# Patient Record
Sex: Male | Born: 1975 | Race: White | Hispanic: No | Smoking: Never smoker
Health system: Southern US, Community
[De-identification: ages and names within clinical notes are randomized; demographics above are authoritative.]

## PROBLEM LIST (undated history)

## (undated) DIAGNOSIS — I10 Essential (primary) hypertension: Secondary | ICD-10-CM

## (undated) DIAGNOSIS — M67439 Ganglion, unspecified wrist: Secondary | ICD-10-CM

## (undated) DIAGNOSIS — H109 Unspecified conjunctivitis: Secondary | ICD-10-CM

## (undated) DIAGNOSIS — R0789 Other chest pain: Secondary | ICD-10-CM

## (undated) HISTORY — DX: Unspecified conjunctivitis: H10.9

## (undated) HISTORY — DX: Ganglion, unspecified wrist: M67.439

## (undated) HISTORY — DX: Essential (primary) hypertension: I10

## (undated) HISTORY — DX: Other chest pain: R07.89

---
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
## 2012-07-19 ENCOUNTER — Encounter: Payer: Self-pay | Admitting: *Deleted

## 2012-08-02 ENCOUNTER — Ambulatory Visit (INDEPENDENT_AMBULATORY_CARE_PROVIDER_SITE_OTHER): Payer: BC Managed Care – PPO | Admitting: Family Medicine

## 2012-08-02 ENCOUNTER — Encounter: Payer: Self-pay | Admitting: Family Medicine

## 2012-08-02 VITALS — BP 131/87 | HR 102 | Temp 98.1°F | Resp 18 | Wt 196.0 lb

## 2012-08-02 DIAGNOSIS — Z1159 Encounter for screening for other viral diseases: Secondary | ICD-10-CM

## 2012-08-02 DIAGNOSIS — Z23 Encounter for immunization: Secondary | ICD-10-CM

## 2012-08-02 NOTE — Patient Instructions (Addendum)
1)  Tdap good for 10 years  2)  MMR- We'll mail you your result.

## 2012-08-03 LAB — MEASLES/MUMPS/RUBELLA IMMUNITY
Mumps IgG: 89 AU/mL — ABNORMAL HIGH (ref <9.00)
Rubella: 7.89 Index — ABNORMAL HIGH (ref <0.90)
Rubeola IgG: >300 AU/mL — ABNORMAL HIGH (ref <25.00)

## 2012-08-12 ENCOUNTER — Encounter: Payer: Self-pay | Admitting: Family Medicine

## 2012-08-12 DIAGNOSIS — Z23 Encounter for immunization: Secondary | ICD-10-CM | POA: Insufficient documentation

## 2012-08-12 DIAGNOSIS — Z1159 Encounter for screening for other viral diseases: Secondary | ICD-10-CM | POA: Insufficient documentation

## 2012-08-12 NOTE — Progress Notes (Signed)
Subjective:    Patient ID: Jeremy Pitts, male    DOB: 28-Feb-1976, 37 y.o.   MRN: [id number]  HPI   Jeremy Pitts is here today to get some things done for school.  He needs to have a MMR titer and he needs a Tdap.  He has done well since his last visit.  He has not had any additional infections.     Review of Systems  Constitutional: Positive for fatigue and unexpected weight change. Negative for activity change and appetite change.  HENT: Negative.   Eyes: Negative.   Respiratory: Negative for cough, chest tightness and shortness of breath.   Cardiovascular: Negative for chest pain, palpitations and leg swelling.  Gastrointestinal: Negative for abdominal pain, diarrhea, constipation and blood in stool.  Endocrine: Negative for polydipsia, polyphagia and polyuria.  Genitourinary: Negative for difficulty urinating.  Musculoskeletal: Negative for myalgias, joint swelling and arthralgias.  Skin: Negative for rash.  Neurological: Negative for dizziness, numbness and headaches.  Hematological: Negative.   Psychiatric/Behavioral: Negative.    Past Medical History  Diagnosis Date  . Conjunctivitis    Past Surgical History  Procedure Laterality Date  . Tonsillectomy     Family History  Problem Relation Age of Onset  . Hypertension Mother   . Hypertension Paternal Uncle   . Stroke Maternal Grandfather    History   Social History  . Marital Status: Unknown    Spouse Name: Jeremy Pitts     Number of Children: 1  . Years of Education: 16   Occupational History  . [employer] rights manager    Social History Main Topics  . Smoking status: Never Smoker   . Smokeless tobacco: Never Used  . Alcohol Use: 2.0 oz/week    4 drink(s) per week     Comment: He will usually have 2 Beers or Glasses of wine twice a week.    . Drug Use: No  . Sexually Active: Yes -- Male partner(s)   Other Topics Concern  . Not on file   Social History Narrative   Marital Status: Married Jeremy Pitts)    Children:  Son Jeremy Pitts)    Pets: Dogs (2) Cats (2) Rabbit (1) Legend Pig (1)    Living Situation: Lives with wife and son.    Occupation: Secretary/administrator    Education: Archivist)    Tobacco Use/Exposure:  None    Alcohol Use:  Occasional (2 Beer or Wine twice per week)    Drug Use:  None   Diet:  Regular   Exercise:  None    Hobbies: Hiking                      Objective:   Physical Exam  Constitutional: He appears well-nourished.  HENT:  Head: Normocephalic.  Nose: Nose normal.  Mouth/Throat: Oropharynx is clear and moist.  Eyes: Conjunctivae are normal. No scleral icterus.  Neck: Neck supple. No thyromegaly present.  Cardiovascular: Normal rate, regular rhythm and normal heart sounds.   Pulmonary/Chest: Effort normal and breath sounds normal.  Abdominal: Soft. He exhibits no mass. There is no tenderness.  Musculoskeletal: Normal range of motion.  Lymphadenopathy:    He has no cervical adenopathy.  Neurological: He is alert.  Skin: Skin is warm and dry. No rash noted.  Psychiatric: He has a normal mood and affect. His behavior is normal. Judgment and thought content normal.  Assessment & Plan:   1)  Checking a MMR titer  2)  Tdap was given without difficulty.

## 2012-10-09 ENCOUNTER — Emergency Department (HOSPITAL_BASED_OUTPATIENT_CLINIC_OR_DEPARTMENT_OTHER): Payer: BC Managed Care – PPO

## 2012-10-09 ENCOUNTER — Encounter (HOSPITAL_BASED_OUTPATIENT_CLINIC_OR_DEPARTMENT_OTHER): Payer: Self-pay | Admitting: *Deleted

## 2012-10-09 ENCOUNTER — Emergency Department (HOSPITAL_BASED_OUTPATIENT_CLINIC_OR_DEPARTMENT_OTHER)
Admission: EM | Admit: 2012-10-09 | Discharge: 2012-10-09 | Disposition: A | Discharge status: Home / Self Care | Payer: BC Managed Care – PPO | Attending: Emergency Medicine | Admitting: Emergency Medicine

## 2012-10-09 DIAGNOSIS — T148XXA Other injury of unspecified body region, initial encounter: Secondary | ICD-10-CM

## 2012-10-09 DIAGNOSIS — Y9289 Other specified places as the place of occurrence of the external cause: Secondary | ICD-10-CM | POA: Insufficient documentation

## 2012-10-09 DIAGNOSIS — Y9319 Activity, other involving water and watercraft: Secondary | ICD-10-CM | POA: Insufficient documentation

## 2012-10-09 DIAGNOSIS — Z8669 Personal history of other diseases of the nervous system and sense organs: Secondary | ICD-10-CM | POA: Insufficient documentation

## 2012-10-09 DIAGNOSIS — Y9315 Activity, underwater diving and snorkeling: Secondary | ICD-10-CM | POA: Insufficient documentation

## 2012-10-09 DIAGNOSIS — R0789 Other chest pain: Secondary | ICD-10-CM | POA: Insufficient documentation

## 2012-10-09 DIAGNOSIS — S43499A Other sprain of unspecified shoulder joint, initial encounter: Secondary | ICD-10-CM | POA: Insufficient documentation

## 2012-10-09 DIAGNOSIS — R51 Headache: Secondary | ICD-10-CM | POA: Insufficient documentation

## 2012-10-09 DIAGNOSIS — T751XXA Unspecified effects of drowning and nonfatal submersion, initial encounter: Secondary | ICD-10-CM | POA: Insufficient documentation

## 2012-10-09 DIAGNOSIS — M549 Dorsalgia, unspecified: Secondary | ICD-10-CM | POA: Insufficient documentation

## 2012-10-09 LAB — URINALYSIS, ROUTINE W REFLEX MICROSCOPIC
Glucose, UA: NEGATIVE mg/dL
Leukocytes, UA: NEGATIVE
Nitrite: NEGATIVE
Protein, ur: NEGATIVE mg/dL

## 2012-10-09 NOTE — ED Provider Notes (Signed)
History    CSN: [id number] Arrival date & time 10/09/12  1335  First MD Initiated Contact with Patient 10/09/12 1359     Chief Complaint  Patient presents with  . Dizziness   (Consider location/radiation/quality/duration/timing/severity/associated sxs/prior Treatment) The history is provided by the patient.   [place] He states he had 2 dives at 120 feet. He states one had a down time of 20 minutes and one had 25 minutes. He states on one of them he missed a safety stop on the way out. He states after it began to feel some tightness in his chest. He feels a little tingling in both of his hands. No Weakness. No Confusion. No hemoptysis. No plane trip. Patient states that he was using someone else's equipment and he may not have been weighted properly for him. He states he was coming quickly, but doesn't think he was coming to fast. He states he just was not able to stop at 15 feet. Past Medical History  Diagnosis Date  . Conjunctivitis    Past Surgical History  Procedure Laterality Date  . Tonsillectomy     Family History  Problem Relation Age of Onset  . Hypertension Mother   . Hypertension Paternal Uncle   . Stroke Maternal Grandfather    History  Substance Use Topics  . Smoking status: Never Smoker   . Smokeless tobacco: Never Used  . Alcohol Use: 2.0 oz/week    4 drink(s) per week     Comment: He will usually have 2 Beers or Glasses of wine twice a week.      Review of Systems  Constitutional: Negative for activity change and appetite change.  HENT: Negative for neck stiffness.   Eyes: Negative for pain.  Respiratory: Negative for chest tightness and shortness of breath.   Cardiovascular: Positive for chest pain. Negative for leg swelling.  Gastrointestinal: Negative for nausea, vomiting, abdominal pain and diarrhea.  Genitourinary: Negative for flank pain.  Musculoskeletal: Positive for back pain.  Skin: Negative for rash.   Neurological: Positive for headaches. Negative for weakness and numbness.  Psychiatric/Behavioral: Negative for behavioral problems.    Allergies  Review of patient's allergies indicates no known allergies.  Home Medications  No current outpatient prescriptions on file. BP 157/87  Pulse 82  Temp(Src) 98.9 F (37.2 C) (Oral)  Resp 16  Ht 5\' 11"  (1.803 m)  Wt 190 lb (86.183 kg)  BMI 26.51 kg/m2  SpO2 98% Physical Exam  Nursing note and vitals reviewed. Constitutional: He is oriented to person, place, and time. He appears well-developed and well-nourished.  HENT:  Head: Normocephalic and atraumatic.  Eyes: EOM are normal. Pupils are equal, round, and reactive to light.  Neck: Normal range of motion. Neck supple.  Cardiovascular: Normal rate, regular rhythm and normal heart sounds.   No murmur heard. Pulmonary/Chest: Effort normal and breath sounds normal. He exhibits tenderness.  Abdominal: Soft. Bowel sounds are normal. He exhibits no distension and no mass. There is no tenderness. There is no rebound and no guarding.  Musculoskeletal: Normal range of motion. He exhibits no edema.  Mild tenderness to bilateral trapezius.  Neurological: He is alert and oriented to person, place, and time. No cranial nerve deficit.  Skin: Skin is warm and dry.  Psychiatric: He has a normal mood and affect.    ED Course  Procedures (including critical care time) Labs Reviewed  URINALYSIS, ROUTINE W REFLEX MICROSCOPIC   Dg Chest 2  View  10/09/2012   *RADIOLOGY REPORT*  Clinical Data: Chest pain  CHEST - 2 VIEW  Comparison: None.  Findings: Cardiomediastinal silhouette is unremarkable.  No acute infiltrate or pleural effusion.  No pulmonary edema.  Bony thorax is unremarkable.  IMPRESSION: No active disease.   Original Report Authenticated By: Natasha Mead, M.D.   1. Muscle strain     Date: 10/09/2012  Rate: 76  Rhythm: normal sinus rhythm  QRS Axis: right  Intervals: normal  ST/T Wave  abnormalities: normal  Conduction Disutrbances:none  Narrative Interpretation:   Old EKG Reviewed: none available   MDM  Patient presents with chest and back pain have a headache. Began a day ago. And recent diving. Discussed with the diver alert network. Unlikely related to decompression and more likely muscle strain. EKG reassuring and chest x-rays negative. Patient be discharged home   Juliet Rude. Rubin Payor, MD 10/09/12 1506

## 2012-10-09 NOTE — ED Notes (Signed)
Dizziness x 2 days. Chest pain and back pain yesterday. Symptoms started after scuba diving in the ocean 120 feet.

## 2014-01-30 ENCOUNTER — Ambulatory Visit: Payer: BC Managed Care – PPO | Admitting: Family Medicine

## 2014-04-04 DIAGNOSIS — R0789 Other chest pain: Secondary | ICD-10-CM

## 2014-04-04 HISTORY — DX: Other chest pain: R07.89

## 2014-06-19 IMAGING — CR DG CHEST 2V
2 series · 2 of 2 positions shown · non-contrast
Comparison: None.

CLINICAL DATA: Chest pain

CHEST - 2 VIEW

[w chest pa]
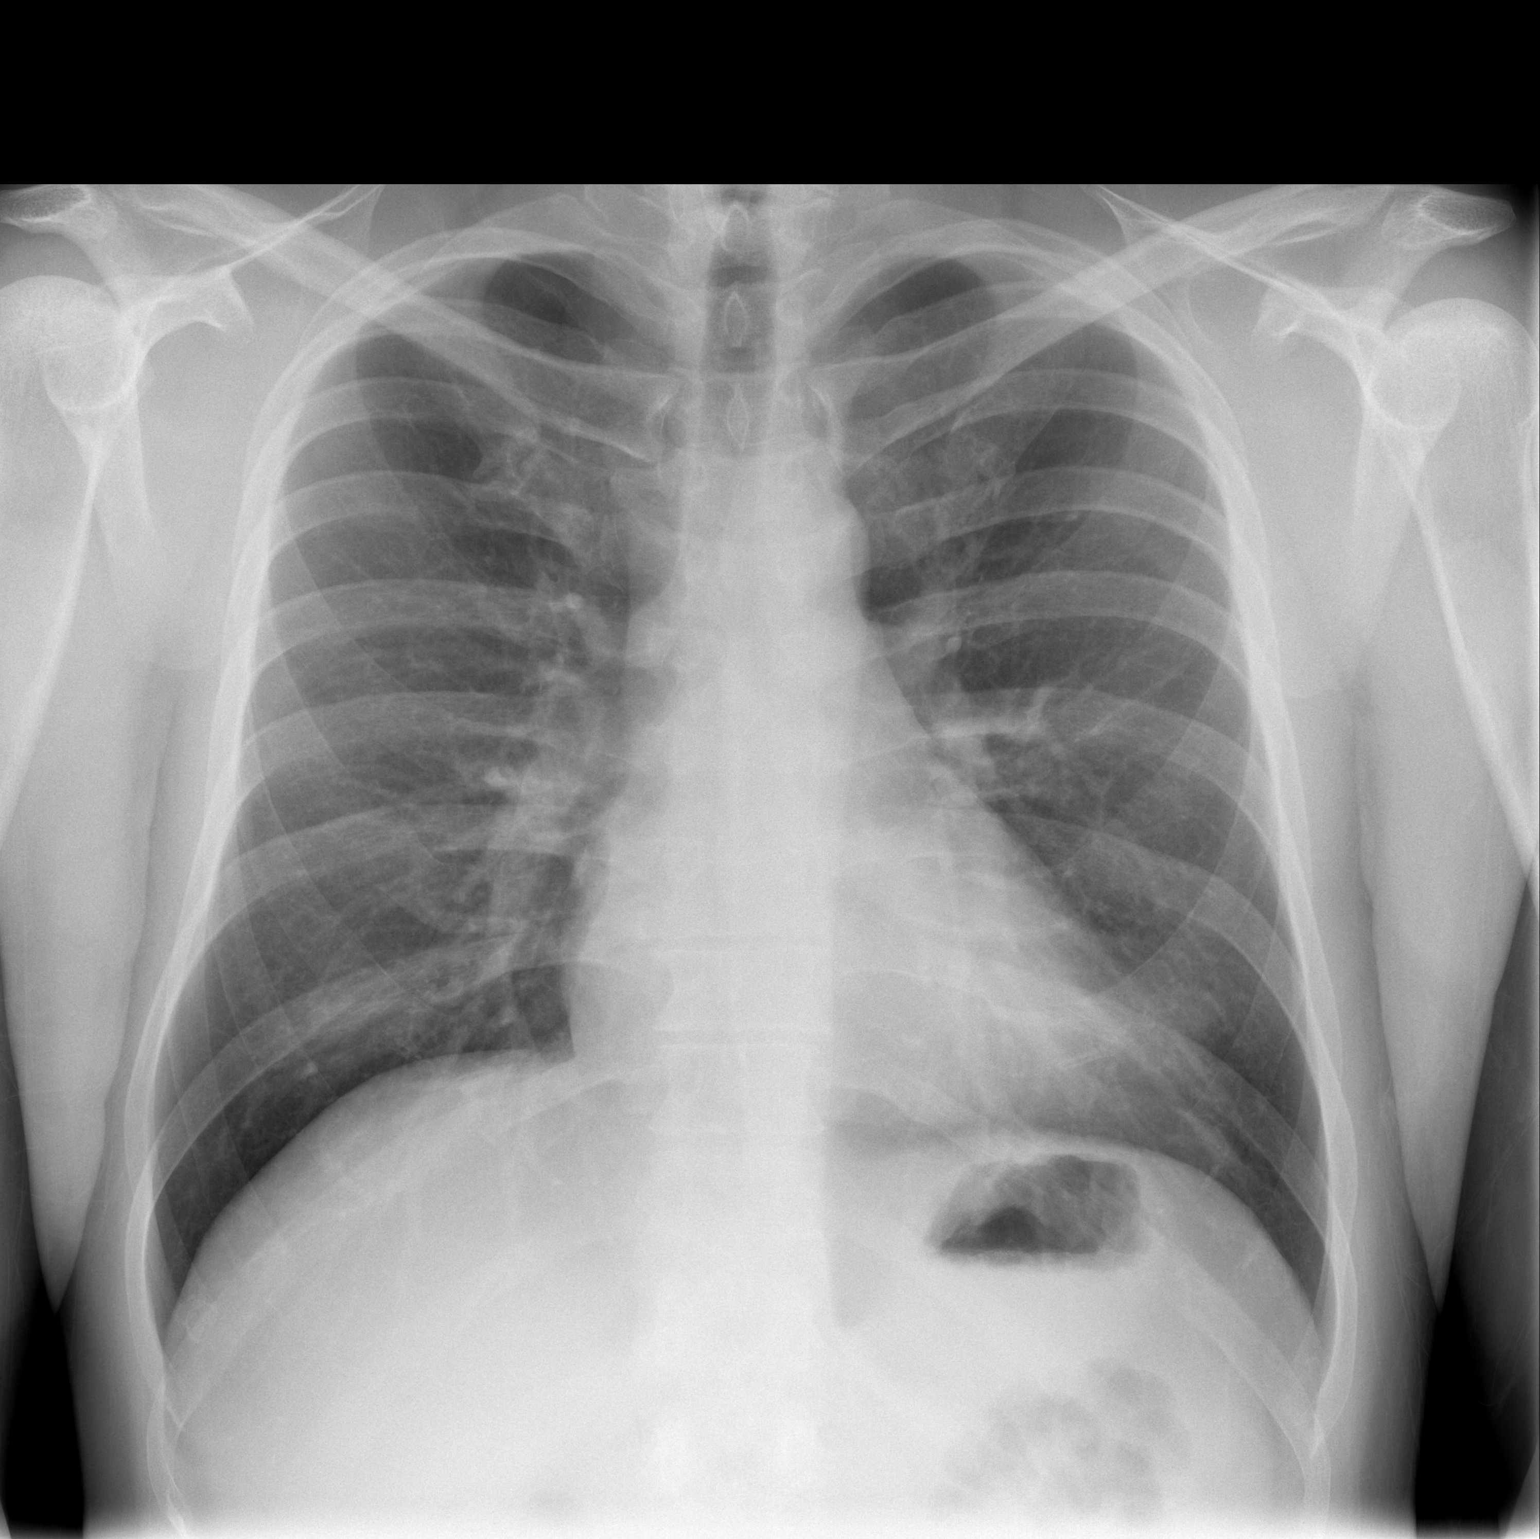

[w chest lat]
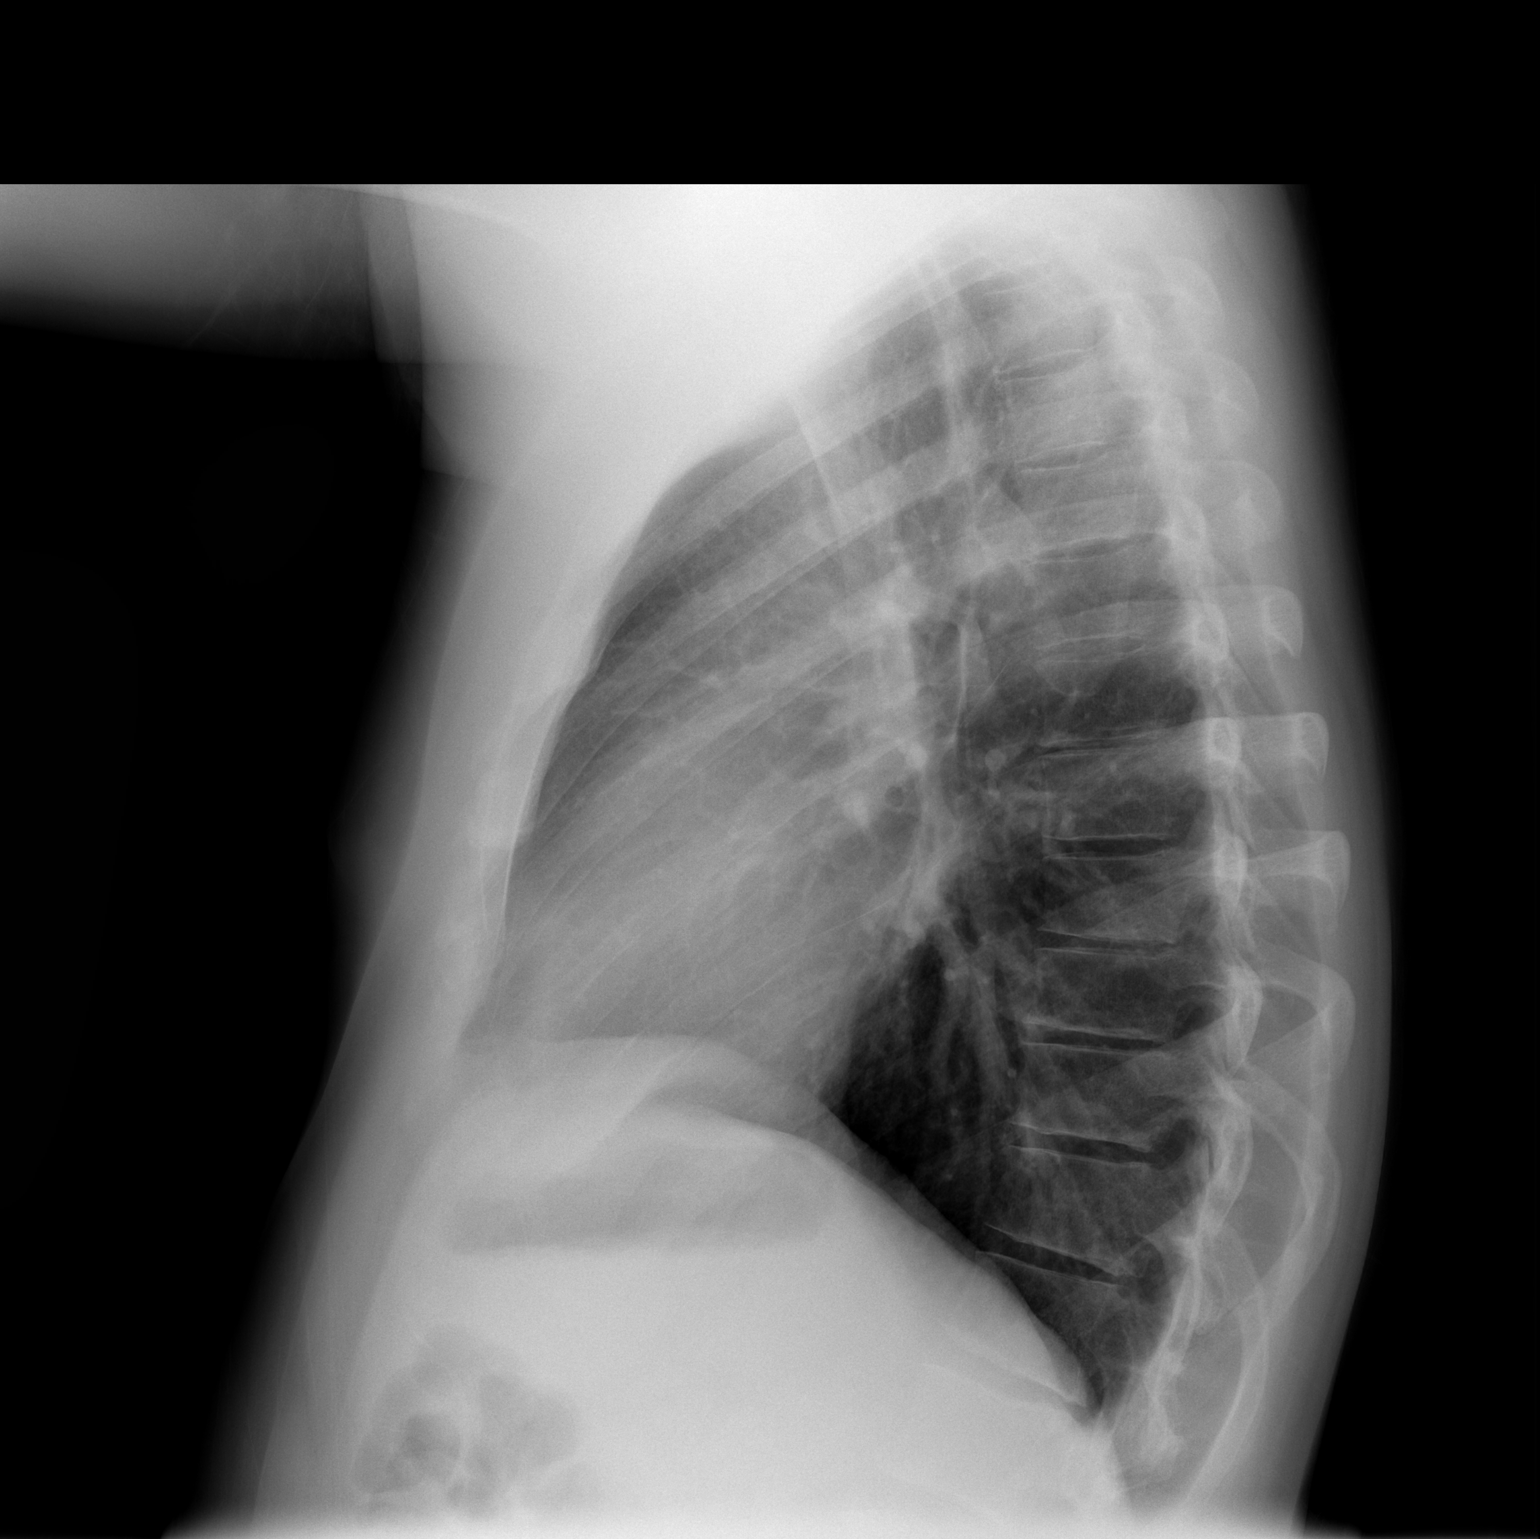

[2 of 2 positions shown; findings below may reference images not displayed]

FINDINGS: Cardiomediastinal silhouette is unremarkable.  No acute
infiltrate or pleural effusion.  No pulmonary edema.  Bony thorax
is unremarkable.
IMPRESSION: No active disease.

## 2014-07-13 ENCOUNTER — Encounter (HOSPITAL_BASED_OUTPATIENT_CLINIC_OR_DEPARTMENT_OTHER): Payer: Self-pay | Admitting: *Deleted

## 2014-07-13 ENCOUNTER — Emergency Department (HOSPITAL_BASED_OUTPATIENT_CLINIC_OR_DEPARTMENT_OTHER): Payer: 59

## 2014-07-13 ENCOUNTER — Emergency Department (HOSPITAL_BASED_OUTPATIENT_CLINIC_OR_DEPARTMENT_OTHER)
Admission: EM | Admit: 2014-07-13 | Discharge: 2014-07-14 | Disposition: A | Discharge status: Home / Self Care | Payer: 59 | Attending: Emergency Medicine | Admitting: Emergency Medicine

## 2014-07-13 DIAGNOSIS — R0789 Other chest pain: Secondary | ICD-10-CM | POA: Insufficient documentation

## 2014-07-13 DIAGNOSIS — R42 Dizziness and giddiness: Secondary | ICD-10-CM | POA: Diagnosis not present

## 2014-07-13 DIAGNOSIS — R079 Chest pain, unspecified: Secondary | ICD-10-CM | POA: Diagnosis present

## 2014-07-13 DIAGNOSIS — M549 Dorsalgia, unspecified: Secondary | ICD-10-CM | POA: Diagnosis not present

## 2014-07-13 DIAGNOSIS — Z8669 Personal history of other diseases of the nervous system and sense organs: Secondary | ICD-10-CM | POA: Diagnosis not present

## 2014-07-13 DIAGNOSIS — R6883 Chills (without fever): Secondary | ICD-10-CM | POA: Diagnosis not present

## 2014-07-13 LAB — COMPREHENSIVE METABOLIC PANEL
ALBUMIN: 4.6 g/dL (ref 3.5–5.2)
ALT: 20 U/L (ref 0–53)
AST: 20 U/L (ref 0–37)
Alkaline Phosphatase: 27 U/L — ABNORMAL LOW (ref 39–117)
Anion gap: 8 (ref 5–15)
BILIRUBIN TOTAL: 0.1 mg/dL — AB (ref 0.3–1.2)
BUN: 13 mg/dL (ref 6–23)
CHLORIDE: 104 mmol/L (ref 96–112)
CO2: 28 mmol/L (ref 19–32)
CREATININE: 1.07 mg/dL (ref 0.50–1.35)
Calcium: 9.5 mg/dL (ref 8.4–10.5)
GFR calc non Af Amer: 86 mL/min — ABNORMAL LOW (ref >90)
GLUCOSE: 105 mg/dL — AB (ref 70–99)
POTASSIUM: 3.6 mmol/L (ref 3.5–5.1)
Sodium: 140 mmol/L (ref 135–145)
TOTAL PROTEIN: 6.8 g/dL (ref 6.0–8.3)

## 2014-07-13 LAB — CBC WITH DIFFERENTIAL/PLATELET
BASOS ABS: 0 10*3/uL (ref 0.0–0.1)
Basophils Relative: 0 % (ref 0–1)
Eosinophils Absolute: 0.1 10*3/uL (ref 0.0–0.7)
Eosinophils Relative: 1 % (ref 0–5)
HCT: 43.8 % (ref 39.0–52.0)
HEMOGLOBIN: 15.3 g/dL (ref 13.0–17.0)
Lymphocytes Relative: 23 % (ref 12–46)
Lymphs Abs: 1.9 10*3/uL (ref 0.7–4.0)
MCH: 32.6 pg (ref 26.0–34.0)
MCHC: 34.9 g/dL (ref 30.0–36.0)
MCV: 93.2 fL (ref 78.0–100.0)
MONOS PCT: 9 % (ref 3–12)
Monocytes Absolute: 0.8 10*3/uL (ref 0.1–1.0)
NEUTROS ABS: 5.6 10*3/uL (ref 1.7–7.7)
Neutrophils Relative %: 67 % (ref 43–77)
PLATELETS: 244 10*3/uL (ref 150–400)
RBC: 4.7 MIL/uL (ref 4.22–5.81)
RDW: 11.4 % — AB (ref 11.5–15.5)
WBC: 8.3 10*3/uL (ref 4.0–10.5)

## 2014-07-13 LAB — LIPASE, BLOOD: LIPASE: 28 U/L (ref 11–59)

## 2014-07-13 LAB — TROPONIN I: Troponin I: <0.03 ng/mL (ref <0.031)

## 2014-07-13 LAB — D-DIMER, QUANTITATIVE (NOT AT ARMC)

## 2014-07-13 MED ORDER — ASPIRIN 325 MG PO TABS
325.0000 mg | ORAL_TABLET | Freq: Once | ORAL | Status: AC
Start: 2014-07-13 — End: 2014-07-13
  Administered 2014-07-13: 325 mg via ORAL
  Filled 2014-07-13: qty 1

## 2014-07-13 MED ORDER — KETOROLAC TROMETHAMINE 30 MG/ML IJ SOLN
30.0000 mg | Freq: Once | INTRAMUSCULAR | Status: AC
  Administered 2014-07-13: 30 mg via INTRAVENOUS
  Filled 2014-07-13: qty 1

## 2014-07-13 NOTE — ED Notes (Signed)
Pt reports back pain at 1800 that then moved to his chest- reports he was dizzy after pain started

## 2014-07-13 NOTE — ED Notes (Signed)
Dr. Yao at BS.  

## 2014-07-13 NOTE — ED Notes (Signed)
Pt alert, NAD, calm, interactive, resps e/u, speaking in clear complete sentences, V[facility] Pt reports mid chest pressure, also describes as acid reflux, heartburn and indigestion, mentions some light headedness earlier when standing, but not now while lying down, (denies: nvd, fever, cough congestion, cold sx, sob, weakness, or other sx), mentions sick child at home.

## 2014-07-13 NOTE — ED Notes (Signed)
Pt up to b/r, steady gait.  ?

## 2014-07-13 NOTE — ED Notes (Signed)
Pt with steady gait to xray, no changes.

## 2014-07-13 NOTE — Discharge Instructions (Signed)
Take ASA 81 mg daily.   See your doctor. You may need a stress test if you still have chest pain.   Return to ER if you have severe chest pain, shortness of breath.

## 2014-07-13 NOTE — ED Notes (Signed)
Alert, NAD, calm, interactive, reading.

## 2014-07-13 NOTE — ED Provider Notes (Signed)
CSN: [id number]     Arrival date & time 07/13/14  2046 History  This chart was scribed for Richardean Canal, MD by Tonye Royalty, ED Scribe. This patient was seen in room MH01/MH01 and the patient's care was started at 9:50 PM.    Chief Complaint  Patient presents with  . Chest Pain   The history is provided by the patient. No language interpreter was used.    HPI Comments: Jeremy Pitts is a 39 y.o. male who presents to the Emergency Department complaining of chest pain. He reports back pain beginning at 1800 while cooking; he sat down to eat then had a sharp pain to his chest. He took his blood pressure using home machine around 1930 and it was measured at 123/73; he took it again approximately 10 minutes after and measured it at 180/110. He states he then had left arm pain and felt faint like he might pass out if he continued standing. On the drive here, he reports feeling cold and shivering. He states symptoms are improved at this time but he still has some generalized chest pain. Pain worse with movement and somewhat pleuritic. He states he has had similar sharp chest pain, 1-2x per year for the past 1-2 years. He was evaluated here once for this but it was soon after scuba diving and thought that might be related. He states his father had HTN and one of his brothers had CABG in 81s. He has never had a stress test. He does not have a PCP since his moved away.    Past Medical History  Diagnosis Date  . Conjunctivitis    Past Surgical History  Procedure Laterality Date  . Tonsillectomy     Family History  Problem Relation Age of Onset  . Hypertension Mother   . Hypertension Paternal Uncle   . Stroke Maternal Grandfather    History  Substance Use Topics  . Smoking status: Never Smoker   . Smokeless tobacco: Never Used  . Alcohol Use: 2.0 oz/week    4 drink(s) per week     Comment: He will usually have 2 Beers or Glasses of wine twice a week.      Review of Systems  Constitutional:  Positive for chills.  Cardiovascular: Positive for chest pain.  Musculoskeletal: Positive for back pain.  Neurological: Positive for light-headedness.  All other systems reviewed and are negative.     Allergies  Review of patient's allergies indicates no known allergies.  Home Medications   Prior to Admission medications   Not on File   BP 132/80 mmHg  Pulse 87  Temp(Src) 99 F (37.2 C) (Oral)  Resp 20  Ht  (1.803 m)  Wt 172 lb (78.019 kg)  BMI 24.00 kg/m2  SpO2 98% Physical Exam  Constitutional: He is oriented to person, place, and time. He appears well-developed and well-nourished.  HENT:  Head: Normocephalic and atraumatic.  Eyes: Conjunctivae are normal.  Neck: Normal range of motion. Neck supple.  Cardiovascular: Normal rate, regular rhythm and normal heart sounds.   No murmur heard. Pulmonary/Chest: Effort normal and breath sounds normal. No respiratory distress. He has no wheezes. He has no rales.  Pain not reproducible with palpation  Abdominal: Soft. There is no tenderness.  Musculoskeletal: Normal range of motion.  Neurological: He is alert and oriented to person, place, and time.  Skin: Skin is warm and dry.  Psychiatric: He has a normal mood and affect.  Nursing note and vitals reviewed.  ED Course  Procedures (including critical care time)  DIAGNOSTIC STUDIES: Oxygen Saturation is 10% on room air, normal by my interpretation.    COORDINATION OF CARE: 9:55 PM Discussed treatment plan with patient at beside, including cardiac workup with 2x troponin measurements. The patient agrees with the plan and has no further questions at this time.   Labs Review Labs Reviewed  CBC WITH DIFFERENTIAL/PLATELET - Abnormal; Notable for the following:    RDW 11.4 (*)    All other components within normal limits  COMPREHENSIVE METABOLIC PANEL - Abnormal; Notable for the following:    Glucose, Bld 105 (*)    Alkaline Phosphatase 27 (*)    Total Bilirubin  0.1 (*)    GFR calc non Af Amer 86 (*)    All other components within normal limits  TROPONIN I  LIPASE, BLOOD  D-DIMER, QUANTITATIVE  TROPONIN I    Imaging Review No results found.   EKG Interpretation   Date/Time:  Sunday July 13 2014 20:55:49 EDT Ventricular Rate:  95 PR Interval:  174 QRS Duration: 96 QT Interval:  350 QTC Calculation: 439 R Axis:   100 Text Interpretation:  Normal sinus rhythm Rightward axis Borderline ECG No  significant change since last tracing Confirmed by Nerissa Constantin  MD, Matteo Banke ([id number])  on 07/13/2014 9:01:00 PM      MDM   Final diagnoses:  None   Jeremy Pitts is a 39 y.o. male here with chest pain. Likely reflux but given family hx of CAD age 1540s, will get delta trop. Slightly tachy, somewhat pleuritic so will get d-dimer.   11:40 PM Pain improved. D-dimer pending. Signed out to Dr. Nicanor AlconPalumbo to follow up d-dimer and delta trop. If neg, can d/c home.   I personally performed the services described in this documentation, which was scribed in my presence. The recorded information has been reviewed and is accurate.   Richardean Canalavid H Babak Lucus, MD 07/13/14 [phone]

## 2014-07-14 ENCOUNTER — Encounter: Payer: Self-pay | Admitting: Nurse Practitioner

## 2014-07-14 ENCOUNTER — Ambulatory Visit (INDEPENDENT_AMBULATORY_CARE_PROVIDER_SITE_OTHER): Payer: 59 | Admitting: Nurse Practitioner

## 2014-07-14 VITALS — BP 126/79 | HR 73 | Temp 98.3°F | Ht 71.0 in | Wt 179.0 lb

## 2014-07-14 DIAGNOSIS — R0789 Other chest pain: Secondary | ICD-10-CM

## 2014-07-14 DIAGNOSIS — F411 Generalized anxiety disorder: Secondary | ICD-10-CM | POA: Diagnosis not present

## 2014-07-14 LAB — TROPONIN I

## 2014-07-14 MED ORDER — LORAZEPAM 0.5 MG PO TABS: 0.5000 mg | ORAL_TABLET | Freq: Two times a day (BID) | ORAL | Status: DC | PRN

## 2014-07-14 NOTE — Progress Notes (Signed)
Pre visit review using our clinic review tool, if applicable. No additional management support is needed unless otherwise documented below in the visit note. 

## 2014-07-14 NOTE — Patient Instructions (Signed)
Your symptoms may be exacerbated by anxiety. Please use loazepam as directed as trial to see if symptoms improve.  Symptoms may be related to indigestion or gallbladder disease. Please take Tums or other antacid to see if symptoms improve.  Please return for fasting labs this week.  Please return for re-evaluation in 1 week to 10 days or sooner if you have concerns.  Nice to meet you.

## 2014-07-14 NOTE — ED Notes (Signed)
Al;ert, NAD, calm, steady gait to b/r

## 2014-07-15 ENCOUNTER — Encounter: Payer: Self-pay | Admitting: Nurse Practitioner

## 2014-07-15 DIAGNOSIS — R0789 Other chest pain: Secondary | ICD-10-CM | POA: Insufficient documentation

## 2014-07-15 DIAGNOSIS — F411 Generalized anxiety disorder: Secondary | ICD-10-CM | POA: Insufficient documentation

## 2014-07-15 NOTE — Progress Notes (Signed)
Subjective:    Jeremy Pitts is a 39 y.o. male who presents to establish care. He c/o chest pain, lightheadedness, "tingly & floaty" sensation since yesterday. Symptoms have been stable since that time. He was seen in ER yesterday. W/u included CXR, troponin X2, d-dimer, CBC, CMET, lipase, ECG. No abnormal findings. Pt was d/c'd home.  Pt reports he was cooking when he felt sharp pain in chest. He took BP-it was nml, took it again few minutes later & it was elevated-160/110 w/HR of 110. Pain is described as starting b/w scapula & radiating to anterior center of chest. Patient rates pain as a 5/10 in intensity. Associated symptoms are: are noted above. Pt denies nausea, diaphoresis, SOB. Aggravating factors are: chest discomfort changes with movement & deep inspiration. Alleviating factors are: none. Patient's cardiac risk factors are: family history of premature cardiovascular disease, male gender and sedentary lifestyle. Patient's risk factors for DVT/PE: none. Pt reports symptoms have persisted through night & today, but to less degree. He appears anxious.  The following portions of the patient's history were reviewed and updated as appropriate: allergies, current medications, past family history, past medical history, past social history, past surgical history and problem list.  Review of Systems Constitutional: negative for fatigue and fevers Ears, nose, mouth, throat, and face: negative for nasal congestion and sore throat Respiratory: negative for cough and wheezing Gastrointestinal: negative for diarrhea and dyspepsia Allergic/Immunologic: negative for itchy throat, ears, eyes    Objective:    BP 126/79 mmHg  Pulse 73  Temp(Src) 98.3 F (36.8 C) (Oral)  Ht 5\' 11"  (1.803 m)  Wt 179 lb (81.194 kg)  BMI 24.98 kg/m2  SpO2 98% General appearance: alert, cooperative, appears stated age, mild distress and appears anxious Head: Normocephalic, without obvious abnormality, atraumatic Eyes:  negative findings: lids and lashes normal, conjunctivae and sclerae normal and wearing glasses Ears: normal TM and external ear canal right ear and abnormal TM right ear - air-fluid level Throat: lips, mucosa, and tongue normal; teeth and gums normal Neck: no adenopathy, no carotid bruit, supple, symmetrical, trachea midline and thyroid not enlarged, symmetric, no tenderness/mass/nodules Lungs: clear to auscultation bilaterally Heart: regular rate and rhythm, S1, S2 normal, no murmur, click, rub or gallop Abdomen: soft, non-tender; bowel sounds normal; no masses,  no organomegaly Extremities: extremities normal, atraumatic, no cyanosis or edema Pulses: 2+ and symmetric Neurologic: Grossly normal  Chest: no reproducible tenderness to palpation of ribs or sternum    Assessment:Plan  1. Anxiety state Could explain all symptoms after initial "sharp pain in chest" - LORazepam (ATIVAN) 0.5 MG tablet; Take 1 tablet (0.5 mg total) by mouth 2 (two) times daily as needed for anxiety.  Dispense: 20 tablet; Refill: 0  2. Chest discomfort DD: anxiety, reflux, gallbladder disease, thyroid disease - T4, free; Future - TSH; Future - Lipid panel; Future - Hemoglobin A1c; Future  See pt instructions. F/u 10 days.

## 2014-07-16 ENCOUNTER — Ambulatory Visit: Payer: 59 | Admitting: Family Medicine

## 2014-07-18 ENCOUNTER — Other Ambulatory Visit (INDEPENDENT_AMBULATORY_CARE_PROVIDER_SITE_OTHER): Payer: 59

## 2014-07-18 DIAGNOSIS — F411 Generalized anxiety disorder: Secondary | ICD-10-CM | POA: Diagnosis not present

## 2014-07-18 DIAGNOSIS — R0789 Other chest pain: Secondary | ICD-10-CM

## 2014-07-18 LAB — LIPID PANEL
CHOL/HDL RATIO: 4
Cholesterol: 189 mg/dL (ref 0–200)
HDL: 44.5 mg/dL (ref >39.00)
LDL CALC: 127 mg/dL — AB (ref 0–99)
NONHDL: 144.5
TRIGLYCERIDES: 89 mg/dL (ref 0.0–149.0)
VLDL: 17.8 mg/dL (ref 0.0–40.0)

## 2014-07-18 LAB — HEMOGLOBIN A1C: Hgb A1c MFr Bld: 5.4 % (ref 4.6–6.5)

## 2014-07-18 LAB — T4, FREE: FREE T4: 1.03 ng/dL (ref 0.60–1.60)

## 2014-07-18 LAB — TSH: TSH: 0.71 u[IU]/mL (ref 0.35–4.50)

## 2014-07-22 ENCOUNTER — Telehealth: Payer: Self-pay | Admitting: Nurse Practitioner

## 2014-07-22 NOTE — Telephone Encounter (Signed)
pls call pt: Advise Nothing concerning in labs.

## 2014-07-22 NOTE — Telephone Encounter (Signed)
Called and informed patient of lab results.  

## 2014-07-23 ENCOUNTER — Ambulatory Visit (INDEPENDENT_AMBULATORY_CARE_PROVIDER_SITE_OTHER): Payer: 59 | Admitting: Family Medicine

## 2014-07-23 ENCOUNTER — Encounter: Payer: Self-pay | Admitting: Family Medicine

## 2014-07-23 VITALS — BP 114/79 | HR 55 | Temp 98.1°F | Resp 18 | Ht 71.0 in | Wt 179.0 lb

## 2014-07-23 DIAGNOSIS — R0789 Other chest pain: Secondary | ICD-10-CM | POA: Diagnosis not present

## 2014-07-23 NOTE — Progress Notes (Signed)
Office Note 07/23/2014  CC:  Chief Complaint  Patient presents with  . Annual Exam   HPI:  Jeremy Pitts is a 39 y.o. White male who is here for follow up visit, atypical CP.  This is my first visit with him. I reviewed his office note from his visit here with Maximino SarinLayne Weaver, FNP last week. He was rx'd ativan prn last week.  Has had "random" nonexertional precordial CP w/out any associated symptoms.  Sometimes central, sometimes L sided and sometimes R sided.  Lasts only a couple minutes.    No exercise.  He is not a smoker.  Says his job is low stress at this time, denies past problems with any significant anxiety or depression.  Denies current anxiety or depression. Some hx of elevated bp's but pt states he decreased sodium intake drastically and his bp's normalized (at maximum point they were 130s over 90s, now regularly 120s/70s per pt).  Past Medical History  Diagnosis Date  . Conjunctivitis   . Chicken pox   . High blood pressure     Past Surgical History  Procedure Laterality Date  . Tonsillectomy  1988    Family History  Problem Relation Age of Onset  . Hypertension Mother   . Hypertension Paternal Uncle   . Heart disease Paternal Uncle 40  . Stroke Maternal Grandfather   . Hypertension Father   . Cancer Paternal Grandmother     leukemia  . Heart disease Paternal Uncle 5040    History   Social History  . Marital Status: Unknown    Spouse Name: Consuella Loselaine   . Number of Children: 2  . Years of Education: 16   Occupational History  . Quality Publishing rights managerngineer  [employer]    Electrical Engineer    Social History Main Topics  . Smoking status: Never Smoker   . Smokeless tobacco: Never Used  . Alcohol Use: 2.0 oz/week    4 Standard drinks or equivalent per week     Comment: He will usually have 2 Beers or Glasses of wine twice a week.    . Drug Use: No  . Sexual Activity:    Partners: Female   Other Topics Concern  . Not on file   Social History  Narrative   Marital Status: Married Consuella Lose(Elaine)    Children:  Son Jacquenette Shone(Julian) daughter   Pets: Dogs (2) Cats (2) Rabbit (1) [place] Pig (1)    Living Situation: Lives with wife and children.    Occupation: Secretary/administratorQuality Engineer - [employer]    Education: ArchivistBachelors Degree (Electrician Engineer)    Tobacco Use/Exposure:  None    Alcohol Use:  Occasional (2 Beer or Wine twice per week)    Drug Use:  None   Diet:  Regular   Exercise:  None    Hobbies: Hiking                    Outpatient Prescriptions Prior to Visit  Medication Sig Dispense Refill  . LORazepam (ATIVAN) 0.5 MG tablet Take 1 tablet (0.5 mg total) by mouth 2 (two) times daily as needed for anxiety. (Patient not taking: Reported on 07/23/2014) 20 tablet 0   No facility-administered medications prior to visit.    No Known Allergies  ROS Review of Systems  Constitutional: Negative for fever and fatigue.  HENT: Negative for congestion and sore throat.   Eyes: Negative for visual disturbance.  Respiratory: Negative for cough.   Cardiovascular: Negative for palpitations  and leg swelling.  Gastrointestinal: Negative for nausea, abdominal pain and blood in stool.  Genitourinary: Negative for dysuria.  Musculoskeletal: Negative for back pain and joint swelling.  Skin: Negative for rash.  Neurological: Negative for weakness and headaches.  Hematological: Negative for adenopathy.  Psychiatric/Behavioral: Negative for dysphoric mood. The patient is not nervous/anxious.     PE; Blood pressure 114/79, pulse 55, temperature 98.1 F (36.7 C), temperature source Temporal, resp. rate 18, height  (1.803 m), weight 179 lb (81.194 kg), SpO2 98 %. Gen: Alert, well appearing.  Patient is oriented to person, place, time, and situation. ZOX:WRUE: no injection, icteris, swelling, or exudate.  EOMI, PERRLA. Mouth: lips without lesion/swelling.  Oral mucosa pink and moist. Oropharynx without erythema, exudate, or swelling.  Neck - No  masses or thyromegaly or limitation in range of motion CV: RRR, no m/r/g.  No chest wall tenderness to palpation. LUNGS: CTA bilat, nonlabored resps, good aeration in all lung fields. EXT: no clubbing, cyanosis, or edema.   Pertinent labs:  Lab Results  Component Value Date   TSH 0.71 07/18/2014   Lab Results  Component Value Date   WBC 8.3 07/13/2014   HGB 15.3 07/13/2014   HCT 43.8 07/13/2014   MCV 93.2 07/13/2014   PLT 244 07/13/2014   Lab Results  Component Value Date   CREATININE 1.07 07/13/2014   BUN 13 07/13/2014   NA 140 07/13/2014   K 3.6 07/13/2014   CL 104 07/13/2014   CO2 28 07/13/2014   Lab Results  Component Value Date   ALT 20 07/13/2014   AST 20 07/13/2014   ALKPHOS 27* 07/13/2014   BILITOT 0.1* 07/13/2014   Lab Results  Component Value Date   CHOL 189 07/18/2014   Lab Results  Component Value Date   HDL 44.50 07/18/2014   Lab Results  Component Value Date   LDLCALC 127* 07/18/2014   Lab Results  Component Value Date   TRIG 89.0 07/18/2014   Lab Results  Component Value Date   CHOLHDL 4 07/18/2014    ASSESSMENT AND PLAN:   Atypical chest pain Low risk for ischemic heart dz but he has been to the ED twice for this and we both feel like a treadmill stress test should be done as the next step. Hopefully this will be entirely normal and he can feel reassured regarding his recent chest pains. Signs/symptoms to call or return for were reviewed and pt expressed understanding. ETT ordered.    An After Visit Summary was printed and given to the patient.  FOLLOW UP:  Return if symptoms worsen or fail to improve.

## 2014-07-23 NOTE — Progress Notes (Signed)
Pre visit review using our clinic review tool, if applicable. No additional management support is needed unless otherwise documented below in the visit note. 

## 2014-07-23 NOTE — Assessment & Plan Note (Signed)
Low risk for ischemic heart dz but he has been to the ED twice for this and we both feel like a treadmill stress test should be done as the next step. Hopefully this will be entirely normal and he can feel reassured regarding his recent chest pains. Signs/symptoms to call or return for were reviewed and pt expressed understanding. ETT ordered.

## 2014-08-03 HISTORY — PX: OTHER SURGICAL HISTORY: SHX169

## 2014-08-29 ENCOUNTER — Other Ambulatory Visit: Payer: Self-pay | Admitting: Family Medicine

## 2014-08-29 ENCOUNTER — Encounter: Payer: Self-pay | Admitting: Family Medicine

## 2014-08-29 ENCOUNTER — Encounter (INDEPENDENT_AMBULATORY_CARE_PROVIDER_SITE_OTHER): Payer: 59 | Admitting: Nurse Practitioner

## 2014-08-29 DIAGNOSIS — R0789 Other chest pain: Secondary | ICD-10-CM

## 2014-08-29 LAB — EXERCISE TOLERANCE TEST
CHL RATE OF PERCEIVED EXERTION: 13
CSEPED: 11 min
CSEPHR: 94 %
Estimated workload: 13.4 METS
MPHR: 181 {beats}/min
Peak HR: 171 {beats}/min
Rest HR: 64 {beats}/min

## 2015-02-06 ENCOUNTER — Encounter: Payer: Self-pay | Admitting: Family Medicine

## 2015-02-06 ENCOUNTER — Ambulatory Visit (INDEPENDENT_AMBULATORY_CARE_PROVIDER_SITE_OTHER): Payer: 59 | Admitting: Family Medicine

## 2015-02-06 VITALS — BP 117/78 | HR 65 | Temp 98.1°F | Resp 16 | Ht 71.0 in | Wt 179.0 lb

## 2015-02-06 DIAGNOSIS — M67432 Ganglion, left wrist: Secondary | ICD-10-CM

## 2015-02-06 NOTE — Progress Notes (Signed)
OFFICE NOTE  02/06/2015  CC:  Chief Complaint  Patient presents with  . Wrist Pain    Left x 2 months, no known injury   HPI: Patient is a 39 y.o. Caucasian male who is here for left wrist pain, onset after lifting heavy bowl, noted a focal swelling in volar aspect of wrist after this.  Left wrist hurts when lifting things but no pain at rest. No tingling or numbness in hand or wrist.  No weakness of hand.  Pertinent PMH:  Past medical, surgical, social, and family history reviewed and no changes are noted since last office visit.  MEDS:  NONE  PE: Blood pressure 117/78, pulse 65, temperature 98.1 F (36.7 C), temperature source Oral, resp. rate 16, height 5\' 11"  (1.803 m), weight 179 lb (81.194 kg), SpO2 92 %. Gen: Alert, well appearing.  Patient is oriented to person, place, time, and situation. Left wrist with full ROM intact w/out pain. No generalized swelling or erythema or warmth of wrist. He has a 2 cm focal rubbery nodule on volar aspect of L wrist that is moveable and does move with movement of the wrist and thumb.  The nodule is mildly tender to palpation. Wrist and hand/finger strength intact.  IMPRESSION AND PLAN:  L wrist ganglion cyst; symptomatic. Refer to orthopedics for further eval/management. Pt understands, agrees with plan.  No meds rx'd.  An After Visit Summary was printed and given to the patient.  FOLLOW UP: prn

## 2015-02-06 NOTE — Progress Notes (Signed)
Pre visit review using our clinic review tool, if applicable. No additional management support is needed unless otherwise documented below in the visit note. 

## 2015-04-09 ENCOUNTER — Encounter: Payer: Self-pay | Admitting: Family Medicine

## 2016-03-22 IMAGING — DX DG CHEST 2V
2 series · 2 of 2 positions shown · non-contrast
Comparison: 10/09/2012

CLINICAL DATA: Chest and back pain.

EXAM:
CHEST  2 VIEW

[chest pa]
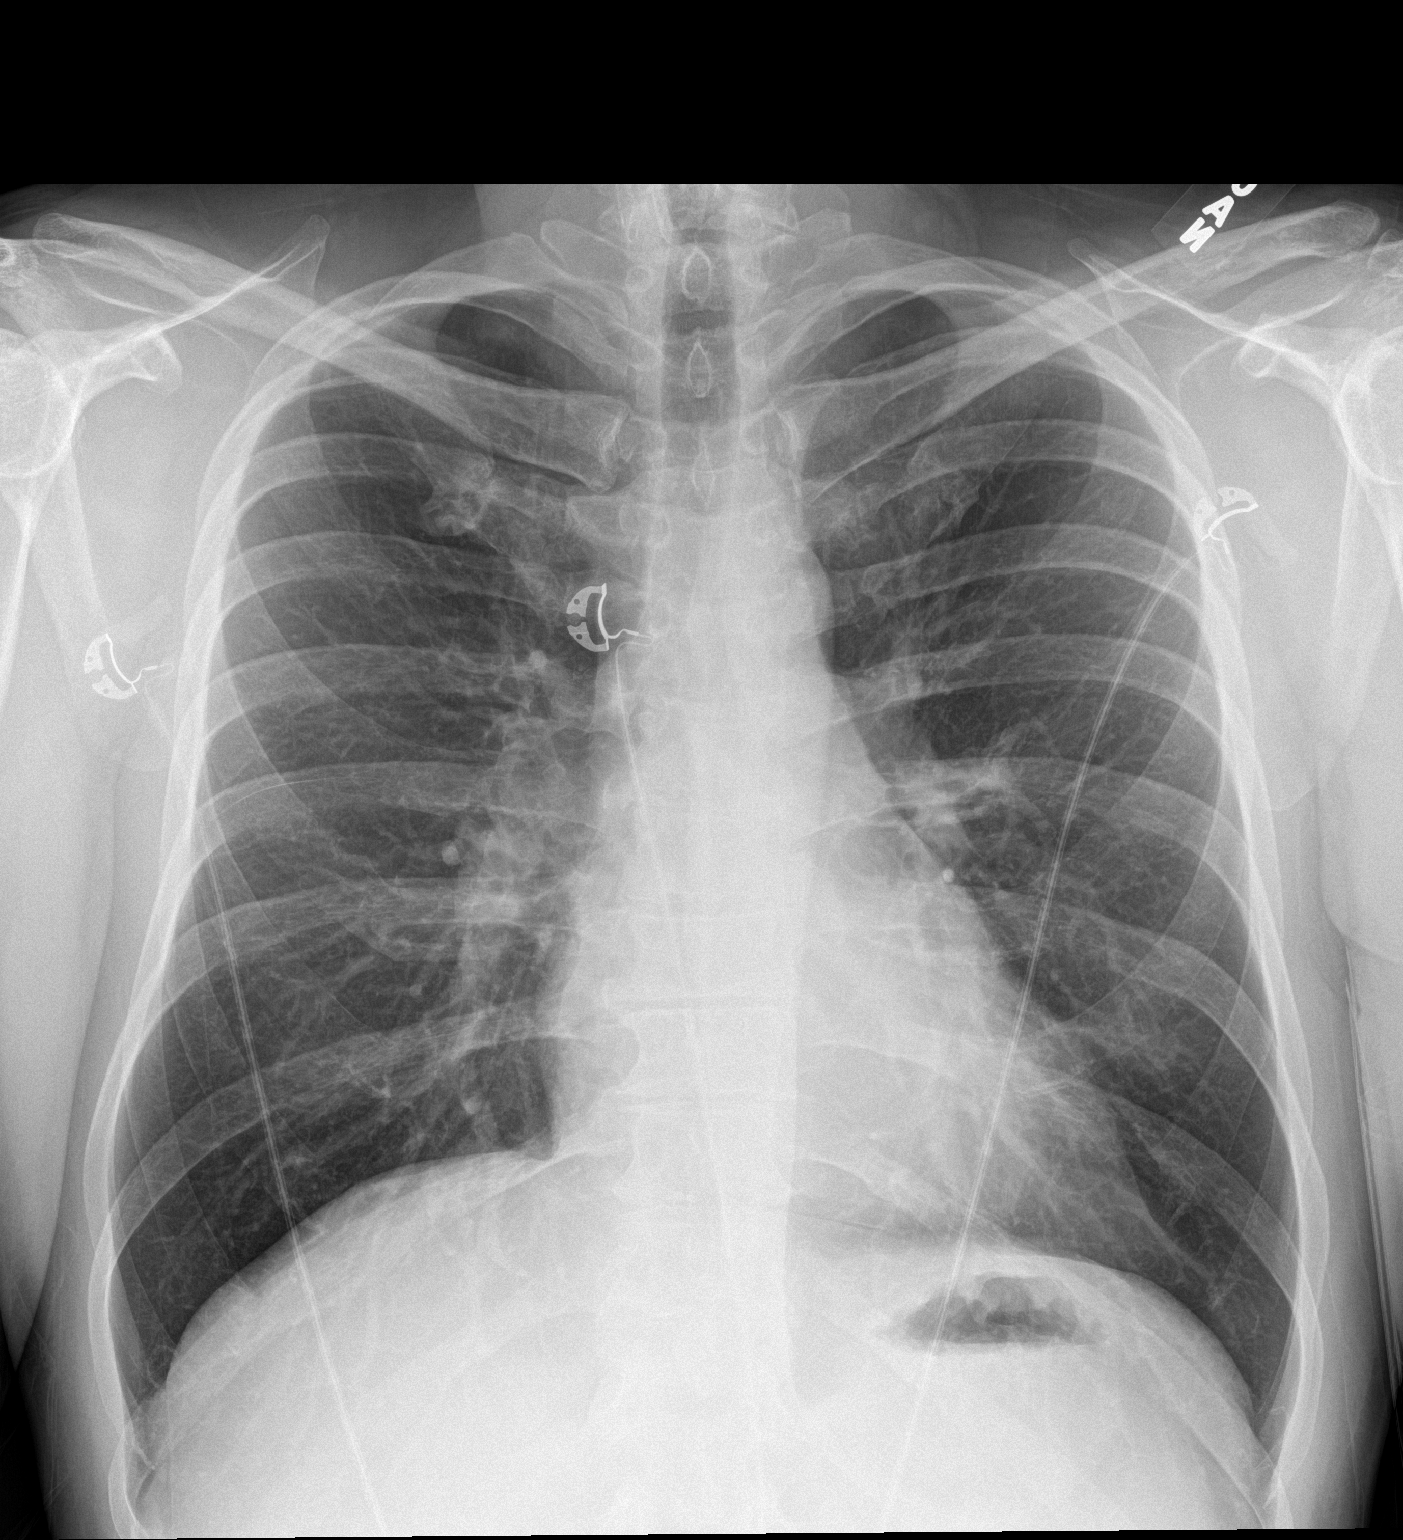

[chest lat]
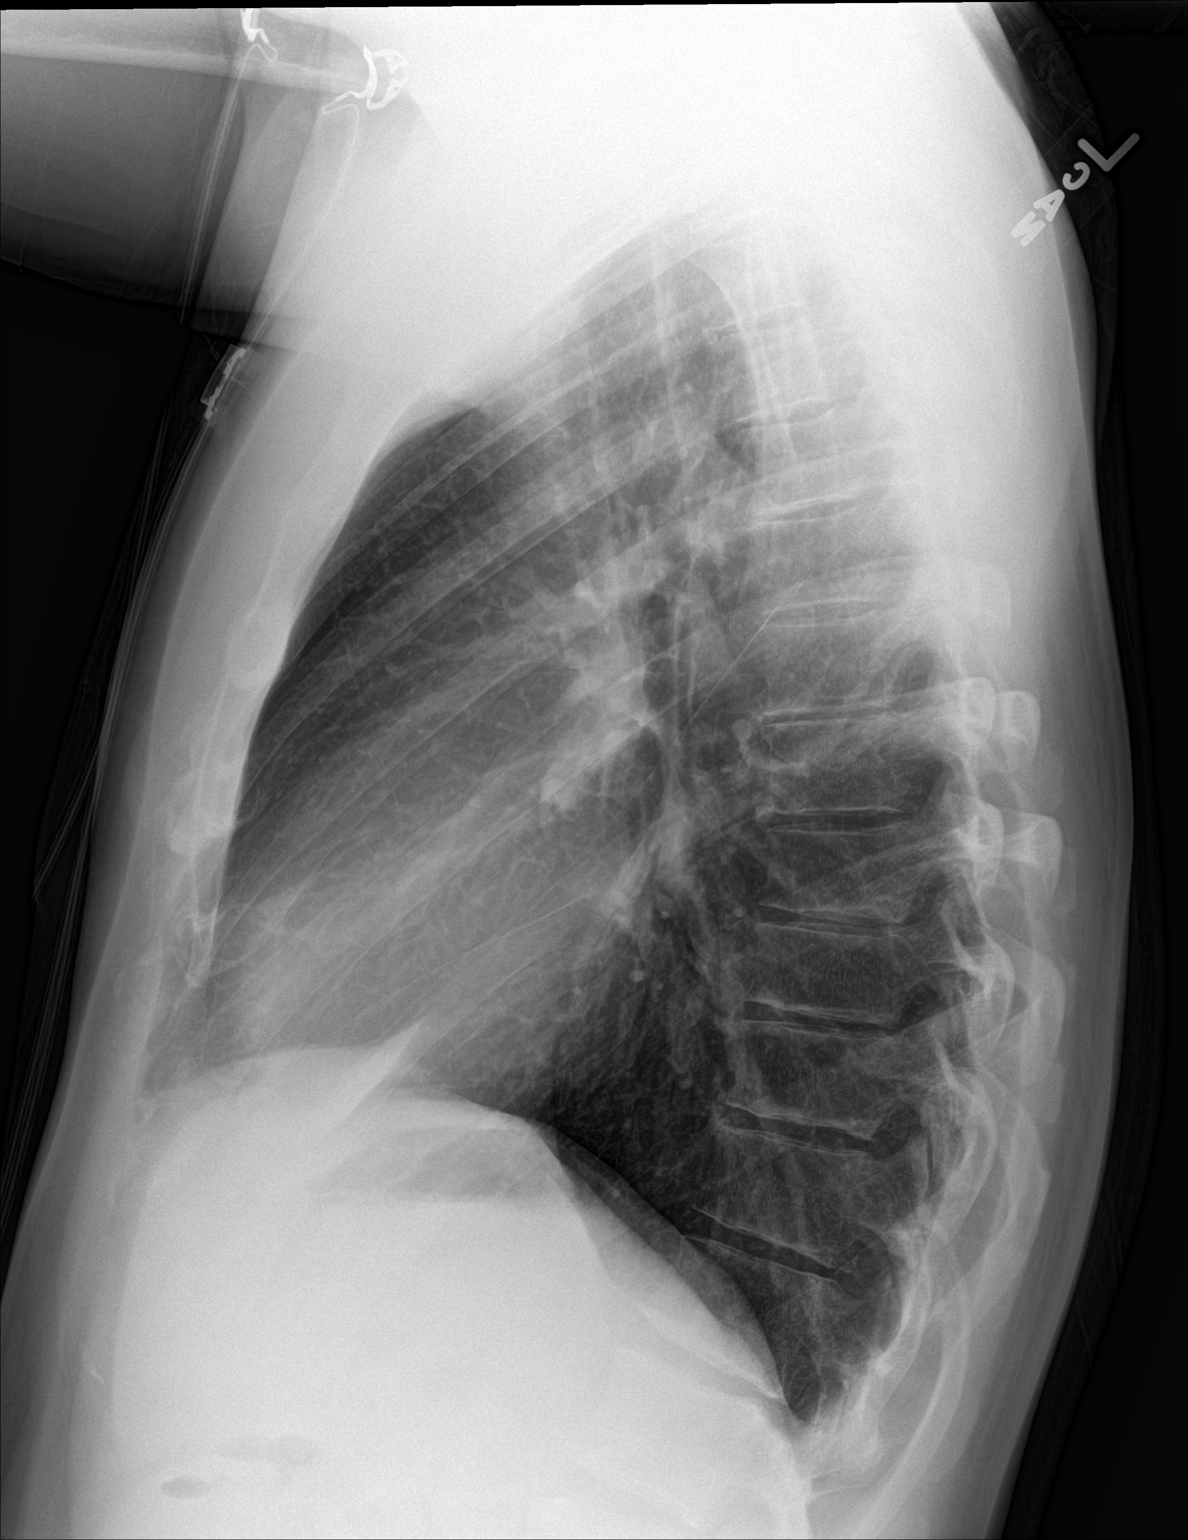

[2 of 2 positions shown; findings below may reference images not displayed]

FINDINGS: The heart size and mediastinal contours are within normal limits.
Both lungs are clear. The visualized skeletal structures are
unremarkable.
IMPRESSION: No active cardiopulmonary disease.

## 2023-01-27 ENCOUNTER — Ambulatory Visit (INDEPENDENT_AMBULATORY_CARE_PROVIDER_SITE_OTHER): Payer: 59 | Admitting: Podiatry

## 2023-01-27 ENCOUNTER — Encounter: Payer: Self-pay | Admitting: Podiatry

## 2023-01-27 VITALS — Ht 70.0 in | Wt 179.0 lb

## 2023-01-27 DIAGNOSIS — M722 Plantar fascial fibromatosis: Secondary | ICD-10-CM | POA: Diagnosis not present

## 2023-01-27 MED ORDER — TRIAMCINOLONE ACETONIDE 10 MG/ML IJ SUSP
10.0000 mg | Freq: Once | INTRAMUSCULAR | Status: AC
  Administered 2023-01-27: 10 mg via INTRA_ARTICULAR

## 2023-01-27 MED ORDER — MELOXICAM 15 MG PO TABS: 15.0000 mg | ORAL_TABLET | Freq: Every day | ORAL | 2 refills | Status: AC

## 2023-01-27 NOTE — Progress Notes (Signed)
Subjective:   Patient ID: Jeremy Pitts, male   DOB: 47 y.o.   MRN: [id number]   HPI Patient presents stating has had a lot of pain in the right arch and states that it is worse at nighttime he develops aching and can wake him up at does not seem to have as much pain during the day.  Patient does not smoke likes to be active   Review of Systems  All other systems reviewed and are negative.       Objective:  Physical Exam Vitals and nursing note reviewed.  Constitutional:      Appearance: He is well-developed.  Pulmonary:     Effort: Pulmonary effort is normal.  Musculoskeletal:        General: Normal range of motion.  Skin:    General: Skin is warm.  Neurological:     Mental Status: He is alert.     Neurovascular status intact muscle strength found to be adequate range of motion is within normal limits with inflammation noted right at the distal portion of the plantar fascia before attachment to the metatarsal heads with fluid in the area and moderate tenderness when palpated.  Patient has good digital perfusion well-oriented     Assessment:  Probability for inflammatory fasciitis of the distal portion of the plantar fascia prior to insertion     Plan:  H&P reviewed condition and went ahead today and did sterile prep carefully injected the distal fascia 3 mg Dexasone Kenalog 5 mg Xylocaine dispensed night splint with heat ice therapy and instructed on usage.  Reappoint as symptoms indicate also placed on oral Mobic

## 2023-03-31 ENCOUNTER — Other Ambulatory Visit (HOSPITAL_BASED_OUTPATIENT_CLINIC_OR_DEPARTMENT_OTHER): Payer: Self-pay | Admitting: Family Medicine

## 2023-03-31 ENCOUNTER — Ambulatory Visit: Payer: 59

## 2023-03-31 DIAGNOSIS — R1011 Right upper quadrant pain: Secondary | ICD-10-CM
